# Patient Record
Sex: Male | Born: 1970 | Race: Black or African American | Hispanic: No | Marital: Single | Smoking: Current every day smoker
Health system: Southern US, Community
[De-identification: ages and names within clinical notes are randomized; demographics above are authoritative.]

## PROBLEM LIST (undated history)

## (undated) DIAGNOSIS — E119 Type 2 diabetes mellitus without complications: Secondary | ICD-10-CM

## (undated) HISTORY — PX: HERNIA REPAIR: SHX51

---

## 2016-07-19 ENCOUNTER — Emergency Department
Admission: EM | Admit: 2016-07-19 | Discharge: 2016-07-20 | Disposition: A | Discharge status: Home / Self Care | Payer: Self-pay | Attending: Student in an Organized Health Care Education/Training Program | Admitting: Student in an Organized Health Care Education/Training Program

## 2016-07-19 DIAGNOSIS — E119 Type 2 diabetes mellitus without complications: Secondary | ICD-10-CM | POA: Insufficient documentation

## 2016-07-19 DIAGNOSIS — L918 Other hypertrophic disorders of the skin: Secondary | ICD-10-CM | POA: Insufficient documentation

## 2016-07-19 DIAGNOSIS — F17200 Nicotine dependence, unspecified, uncomplicated: Secondary | ICD-10-CM | POA: Insufficient documentation

## 2016-07-19 HISTORY — DX: Type 2 diabetes mellitus without complications: E11.9

## 2016-07-19 NOTE — ED Triage Notes (Signed)
Pt presents to ED with c/o left-sided scrotal bleeding for the past three hrs. Pt reports he "scratched a mole" off his scrotum and it has been bleeding every since. Pt denies any c/o pain; reports use of daily aspirin, but denies use of other blood thinners.

## 2016-07-20 NOTE — Discharge Instructions (Signed)
You appear to have had a traumatic removal of a skin tag on your scrotum. Continue to monitor the area. Apply direct pressure to the skin if bleeding recurs. Follow-up with dermatology for definitive treatment as needed.

## 2016-07-20 NOTE — ED Provider Notes (Signed)
[facility] Emergency Department Provider Note ____________________________________________  Time seen: 2345  I have reviewed the triage vital signs and the nursing notes.  HISTORY  Chief Complaint  Groin Pain  HPI William Mcneil is a 45 y.o. male presents to the ED for evaluation of now resolved, bleeding to his scrotum on the left side. Patient describes he was at home sitting on the couch, when he noticed a wet sensation to his underwear. He thought at the time he actually may have been sweating. When he looked down he noticed that he had blood that has soaked through his underwear at his pants, onto a leather couch.He denies any pain, injury, trauma or swelling to the scrotum or testicles. He believes he may have accidentally scratched or needed a skin tag on his scrotum. He did this point reports the leading is completely resolved. He reports noting a large clot in his underwear at the outset. He describes the bleeding lasted about 3 hours from the time of onset. He reports now at the encouragement of his wife for further evaluation. He denies any dysuria, hematuria, or previous history. He denies any use of blood thinners but takes a baby aspirin, most days.  Past Medical History:  Diagnosis Date  . Diabetes mellitus without complication (HCC)     There are no active problems to display for this patient.   Past Surgical History:  Procedure Laterality Date  . HERNIA REPAIR      Prior to Admission medications   Not on File   Allergies Morphine and related  No family history on file.  Social History Social History  Substance Use Topics  . Smoking status: Current Every Day Smoker  . Smokeless tobacco: Never Used  . Alcohol use Yes    Review of Systems  Constitutional: Negative for fever. Cardiovascular: Negative for chest pain. Respiratory: Negative for shortness of breath. Gastrointestinal: Negative for abdominal pain, vomiting and  diarrhea. Genitourinary: Negative for dysuria. Bleeding from the scrotum as above. Musculoskeletal: Negative for back pain. Skin: Negative for rash. Admits to multiple skin tags to the scrotum. Neurological: Negative for headaches, focal weakness or numbness. ____________________________________________  PHYSICAL EXAM:  VITAL SIGNS: ED Triage Vitals  Enc Vitals Group     BP 07/19/16 2213 134/84     Pulse Rate 07/19/16 2213 84     Resp 07/19/16 2213 18     Temp 07/19/16 2213 98.6 F (37 C)     Temp Source 07/19/16 2213 Oral     SpO2 07/19/16 2213 98 %     Weight 07/19/16 2213 220 lb (99.8 kg)     Height 07/19/16 2213 6\' 3"  (1.905 m)     Head Circumference --      Peak Flow --      Pain Score 07/19/16 2219 0     Pain Loc --      Pain Edu? --      Excl. in GC? --    Constitutional: Alert and oriented. Well appearing and in no distress. Head: Normocephalic and atraumatic. Cardiovascular: Normal rate, regular rhythm. Normal distal pulses. Respiratory: Normal respiratory effort.  Musculoskeletal: Nontender with normal range of motion in all extremities.  Skin:  Skin is warm, dry and intact. No rash noted. Patient noted to have multiple contacts to the scrotum without any signs of infection, induration, or swelling. He is noted to have a small superficial abrasion to the scrotum in the area where he believes the bleeding occurred. It  appears that a small skin tag had been dislodged from the same area. No active bleeding is noted. ____________________________________________  INITIAL IMPRESSION / ASSESSMENT AND PLAN / ED COURSE  Patient with recent accidental/incidental trauma to a skin tag on his scrotum, resulting in bleeding. Patient now with bleeding resolved and no residual inflammation, induration, or infection noted to the scrotum. He is discharged with instructions to continue to monitor area as necessary. He will avoid any trauma to the scrotum going forward. He was further  referred to dermatology for definitive treatment of his remaining skin tags.  Clinical Course    ____________________________________________  FINAL CLINICAL IMPRESSION(S) / ED DIAGNOSES  Final diagnoses:  Skin tag      Lissa HoardJenise V Bacon Margarett Viti, PA-C 07/20/16 0037    Willy EddyPatrick Robinson, MD 07/20/16 [phone]

## 2016-11-24 ENCOUNTER — Emergency Department
Admission: EM | Admit: 2016-11-24 | Discharge: 2016-11-24 | Disposition: A | Discharge status: Home / Self Care | Payer: Self-pay | Attending: Emergency Medicine | Admitting: Emergency Medicine

## 2016-11-24 ENCOUNTER — Encounter: Payer: Self-pay | Admitting: Emergency Medicine

## 2016-11-24 DIAGNOSIS — Y999 Unspecified external cause status: Secondary | ICD-10-CM | POA: Insufficient documentation

## 2016-11-24 DIAGNOSIS — Y929 Unspecified place or not applicable: Secondary | ICD-10-CM | POA: Insufficient documentation

## 2016-11-24 DIAGNOSIS — F17200 Nicotine dependence, unspecified, uncomplicated: Secondary | ICD-10-CM | POA: Insufficient documentation

## 2016-11-24 DIAGNOSIS — E119 Type 2 diabetes mellitus without complications: Secondary | ICD-10-CM | POA: Insufficient documentation

## 2016-11-24 DIAGNOSIS — X58XXXA Exposure to other specified factors, initial encounter: Secondary | ICD-10-CM | POA: Insufficient documentation

## 2016-11-24 DIAGNOSIS — Y939 Activity, unspecified: Secondary | ICD-10-CM | POA: Insufficient documentation

## 2016-11-24 DIAGNOSIS — S39012A Strain of muscle, fascia and tendon of lower back, initial encounter: Secondary | ICD-10-CM | POA: Insufficient documentation

## 2016-11-24 MED ORDER — KETOROLAC TROMETHAMINE 60 MG/2ML IM SOLN
60.0000 mg | Freq: Once | INTRAMUSCULAR | Status: AC
  Administered 2016-11-24: 60 mg via INTRAMUSCULAR
  Filled 2016-11-24: qty 2

## 2016-11-24 MED ORDER — MELOXICAM 15 MG PO TABS: 15.0000 mg | ORAL_TABLET | Freq: Every day | ORAL | 0 refills | Status: DC

## 2016-11-24 MED ORDER — ORPHENADRINE CITRATE 30 MG/ML IJ SOLN
60.0000 mg | Freq: Once | INTRAMUSCULAR | Status: DC
  Filled 2016-11-24: qty 2

## 2016-11-24 MED ORDER — DIAZEPAM 5 MG PO TABS
5.0000 mg | ORAL_TABLET | Freq: Once | ORAL | Status: AC
  Administered 2016-11-24: 5 mg via ORAL

## 2016-11-24 MED ORDER — DIAZEPAM 5 MG PO TABS
ORAL_TABLET | ORAL | Status: AC
  Administered 2016-11-24: 5 mg via ORAL
  Filled 2016-11-24: qty 1

## 2016-11-24 MED ORDER — METHOCARBAMOL 500 MG PO TABS: 500.0000 mg | ORAL_TABLET | Freq: Four times a day (QID) | ORAL | 0 refills | Status: DC

## 2016-11-24 NOTE — ED Provider Notes (Signed)
[facility] Emergency Department Provider Note  ____________________________________________  Time seen: Approximately 10:08 PM  I have reviewed the triage vital signs and the nursing notes.   HISTORY  Chief Complaint Back Pain    HPI William Mcneil is a 46 y.o. male who presents to emergency department complaining of lower back pain. Patient denies any injury. He reports that it is bilateral lower back. He denies any repetitive motions or heavy lifting. No history of back problems in the past. He denies any radicular symptoms. He denies any bowel or bladder distention, 7 anesthesia, paresthesias. No medications prior to arrival. No other pain complaints.   Past Medical History:  Diagnosis Date  . Diabetes mellitus without complication (HCC)     There are no active problems to display for this patient.   Past Surgical History:  Procedure Laterality Date  . HERNIA REPAIR      Prior to Admission medications   Medication Sig Start Date End Date Taking? Authorizing Provider  meloxicam (MOBIC) 15 MG tablet Take 1 tablet (15 mg total) by mouth daily. 11/24/16   Delorise Royals Mahreen Schewe, PA-C  methocarbamol (ROBAXIN) 500 MG tablet Take 1 tablet (500 mg total) by mouth 4 (four) times daily. 11/24/16   Delorise Royals Nakai Pollio, PA-C    Allergies Morphine and related  No family history on file.  Social History Social History  Substance Use Topics  . Smoking status: Current Every Day Smoker  . Smokeless tobacco: Never Used  . Alcohol use Yes     Review of Systems  Constitutional: No fever/chills Cardiovascular: no chest pain. Respiratory: no cough. No SOB. Gastrointestinal: No abdominal pain.  No nausea, no vomiting.  No diarrhea.  No constipation. Genitourinary: Negative for dysuria. No hematuria Musculoskeletal: Positive for bilateral lower back pain Skin: Negative for rash, abrasions, lacerations, ecchymosis. Neurological: Negative for headaches, focal  weakness or numbness. 10-point ROS otherwise negative.  ____________________________________________   PHYSICAL EXAM:  VITAL SIGNS: ED Triage Vitals  Enc Vitals Group     BP 11/24/16 2052 121/73     Pulse Rate 11/24/16 2052 80     Resp 11/24/16 2052 18     Temp 11/24/16 2052 98 F (36.7 C)     Temp Source 11/24/16 2052 Oral     SpO2 11/24/16 2052 100 %     Weight 11/24/16 2051 230 lb (104.3 kg)     Height 11/24/16 2051  (1.905 m)     Head Circumference --      Peak Flow --      Pain Score 11/24/16 2051 8     Pain Loc --      Pain Edu? --      Excl. in GC? --      Constitutional: Alert and oriented. Well appearing and in no acute distress. Eyes: Conjunctivae are normal. PERRL. EOMI. Head: Atraumatic. Neck: No stridor.    Cardiovascular: Normal rate, regular rhythm. Normal S1 and S2.  Good peripheral circulation. Respiratory: Normal respiratory effort without tachypnea or retractions. Lungs CTAB. Good air entry to the bases with no decreased or absent breath sounds. Musculoskeletal: Full range of motion to all extremities. No gross deformities appreciated. No visible foreign spine upon inspection. No midline tenderness. Patient is very tender to palpation bilateral paraspinal muscle groups greater on right than left. Palpable spasms are appreciated right-sided paraspinal muscle group. Patient is moderately tender to palpation and right sciatic notch. No tenderness to palpation on the left sciatic notch. Negative straight  leg raise bilaterally. Dorsalis pedis pulse intact bilateral lower shin raise. Sensation intact and equal bilateral lower extremity's. Neurologic:  Normal speech and language. No gross focal neurologic deficits are appreciated.  Skin:  Skin is warm, dry and intact. No rash noted. Psychiatric: Mood and affect are normal. Speech and behavior are normal. Patient exhibits appropriate insight and judgement.   ____________________________________________    LABS (all labs ordered are listed, but only abnormal results are displayed)  Labs Reviewed - No data to display ____________________________________________  EKG   ____________________________________________  RADIOLOGY   No results found.  ____________________________________________    PROCEDURES  Procedure(s) performed:    Procedures    Medications  ketorolac (TORADOL) injection 60 mg (not administered)  orphenadrine (NORFLEX) injection 60 mg (not administered)     ____________________________________________   INITIAL IMPRESSION / ASSESSMENT AND PLAN / ED COURSE  Pertinent labs & imaging results that were available during my care of the patient were reviewed by me and considered in my medical decision making (see chart for details).  Review of the [place] CSRS was performed in accordance of the [facility] prior to dispensing any controlled drugs.     Patient's diagnosis is consistent with lumbar or spinal muscle strain. No injury. No concerning symptoms. No indication for imaging at this time. Patient is given Toradol muscle relaxer injection in the emergency department.. Patient will be discharged home with prescriptions for anti-inflammatory and muscle relaxer. Patient is to follow up with orthopedics as needed or otherwise directed. Patient is given ED precautions to return to the ED for any worsening or new symptoms.     ____________________________________________  FINAL CLINICAL IMPRESSION(S) / ED DIAGNOSES  Final diagnoses:  Strain of lumbar region, initial encounter      NEW MEDICATIONS STARTED DURING THIS VISIT:  New Prescriptions   MELOXICAM (MOBIC) 15 MG TABLET    Take 1 tablet (15 mg total) by mouth daily.   METHOCARBAMOL (ROBAXIN) 500 MG TABLET    Take 1 tablet (500 mg total) by mouth 4 (four) times daily.        This chart was dictated using voice recognition software/Dragon. Despite best efforts to proofread, errors can occur which  can change the meaning. Any change was purely unintentional.    Racheal Patches, PA-C 11/24/16 2227    Minna Antis, MD 11/24/16 2250

## 2016-11-24 NOTE — ED Triage Notes (Signed)
Patient ambulatory to triage with steady gait, without difficulty or distress noted; pt reports lower back pain x 2 days; denies any known injury, denies hx of same, denies any accomp symptoms or radiating pain

## 2016-11-24 NOTE — ED Notes (Signed)
Pharmacy called for medication.

## 2016-11-29 ENCOUNTER — Emergency Department (HOSPITAL_COMMUNITY)
Admission: EM | Admit: 2016-11-29 | Discharge: 2016-11-29 | Disposition: A | Discharge status: Home / Self Care | Payer: Self-pay | Attending: Emergency Medicine | Admitting: Emergency Medicine

## 2016-11-29 ENCOUNTER — Encounter (HOSPITAL_COMMUNITY): Payer: Self-pay

## 2016-11-29 DIAGNOSIS — M62830 Muscle spasm of back: Secondary | ICD-10-CM | POA: Insufficient documentation

## 2016-11-29 DIAGNOSIS — E119 Type 2 diabetes mellitus without complications: Secondary | ICD-10-CM | POA: Insufficient documentation

## 2016-11-29 DIAGNOSIS — F17200 Nicotine dependence, unspecified, uncomplicated: Secondary | ICD-10-CM | POA: Insufficient documentation

## 2016-11-29 MED ORDER — DIAZEPAM 2 MG PO TABS
2.0000 mg | ORAL_TABLET | Freq: Once | ORAL | Status: AC
  Administered 2016-11-29: 2 mg via ORAL
  Filled 2016-11-29: qty 1

## 2016-11-29 MED ORDER — DIAZEPAM 2 MG PO TABS: 2.0000 mg | ORAL_TABLET | Freq: Three times a day (TID) | ORAL | 0 refills | Status: DC | PRN
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
## 2016-11-29 NOTE — ED Notes (Signed)
See provider assessment 

## 2016-11-29 NOTE — ED Triage Notes (Addendum)
Pt reports lower back spasms and pain, onset 3-4 days ago. Denies trouble urinating or abnormal urinary symptoms. Denies recent injury except for bending over on Wednesday and then he noticed the pain and is progressively getting worse.. Ambulatory, NAD.

## 2016-11-29 NOTE — Discharge Instructions (Signed)
Use the valium as directed for muscle spasms. You may break the tablet in half if needed.  If continued symptoms despite treatment, please follow-up with the referral provider listed.

## 2017-03-22 ENCOUNTER — Encounter: Payer: Self-pay | Admitting: Emergency Medicine

## 2017-03-22 ENCOUNTER — Emergency Department
Admission: EM | Admit: 2017-03-22 | Discharge: 2017-03-22 | Disposition: A | Discharge status: Home / Self Care | Payer: Self-pay | Attending: Emergency Medicine | Admitting: Emergency Medicine

## 2017-03-22 DIAGNOSIS — E119 Type 2 diabetes mellitus without complications: Secondary | ICD-10-CM | POA: Insufficient documentation

## 2017-03-22 DIAGNOSIS — H9202 Otalgia, left ear: Secondary | ICD-10-CM

## 2017-03-22 DIAGNOSIS — Z79899 Other long term (current) drug therapy: Secondary | ICD-10-CM | POA: Insufficient documentation

## 2017-03-22 DIAGNOSIS — H60502 Unspecified acute noninfective otitis externa, left ear: Secondary | ICD-10-CM | POA: Insufficient documentation

## 2017-03-22 DIAGNOSIS — F17200 Nicotine dependence, unspecified, uncomplicated: Secondary | ICD-10-CM | POA: Insufficient documentation

## 2017-03-22 MED ORDER — LIDOCAINE HCL (PF) 1 % IJ SOLN
2.0000 mL | Freq: Once | INTRAMUSCULAR | Status: AC
  Administered 2017-03-22: 2 mL
  Filled 2017-03-22: qty 5

## 2017-03-22 MED ORDER — CIPROFLOXACIN-DEXAMETHASONE 0.3-0.1 % OT SUSP
4.0000 [drp] | Freq: Once | OTIC | Status: AC
  Administered 2017-03-22: 4 [drp] via OTIC
  Filled 2017-03-22: qty 7.5

## 2017-03-22 NOTE — ED Triage Notes (Signed)
Patient ambulatory to triage with steady gait, without difficulty or distress noted; pt reports pain to left ear last few days with some drainage; denies any recent illness

## 2017-03-22 NOTE — Discharge Instructions (Signed)
1. Apply antibiotic drops 4 drops to left ear twice daily 7 days. 2. You may take Tylenol and/or ibuprofen as needed for pain. 3. Return to the ER for worsening symptoms, persistent vomiting, difficulty breathing or other concerns.

## 2017-03-22 NOTE — ED Notes (Signed)
Patient verbalizes understanding of d/c instructions and follow-up. VS stable and pain controlled per patient.  Patient in NAD at time of d/c and denies further concerns regarding this visit. Patient stable at the time of departure from the unit, departing unit by the safest and most appropriate manner per that patients condition and limitations. Patient advised to return to the ED at any time for emergent concerns, or for new/worsening symptoms.   Pt refused wheel chair out  

## 2017-03-22 NOTE — ED Provider Notes (Signed)
[facility] Emergency Department Provider Note   ____________________________________________   First MD Initiated Contact with Patient 03/22/17 0533     (approximate)  I have reviewed the triage vital signs and the nursing notes.   HISTORY  Chief Complaint Otalgia    HPI William Mcneil is a 46 y.o. male who presents to the ED from home with a chief complaint of left ear pain. Patient reports a 2 to three-day historyof non-traumatic left ear pain with discharge. Denies barotrauma. Denies associated fever, chills, chest pain, shortness of breath, abdominal pain, nausea, vomiting. Denies recent travel or trauma. Has not tried anything for his pain.   Past Medical History:  Diagnosis Date  . Diabetes mellitus without complication (HCC)     There are no active problems to display for this patient.   Past Surgical History:  Procedure Laterality Date  . HERNIA REPAIR      Prior to Admission medications   Medication Sig Start Date End Date Taking? Authorizing Provider  diazepam (VALIUM) 2 MG tablet Take 1 tablet (2 mg total) by mouth every 8 (eight) hours as needed for muscle spasms. 11/29/16   [name], NP  meloxicam (MOBIC) 15 MG tablet Take 1 tablet (15 mg total) by mouth daily. 11/24/16   Cuthriell, Delorise Royals, PA-C  methocarbamol (ROBAXIN) 500 MG tablet Take 1 tablet (500 mg total) by mouth 4 (four) times daily. 11/24/16   Cuthriell, Delorise Royals, PA-C    Allergies Morphine and related  No family history on file.  Social History Social History  Substance Use Topics  . Smoking status: Current Every Day Smoker  . Smokeless tobacco: Never Used  . Alcohol use Yes    Review of Systems  Constitutional: No fever/chills. Eyes: No visual changes. ENT: positive for left ear pain. No sore throat. Cardiovascular: Denies chest pain. Respiratory: Denies shortness of breath. Gastrointestinal: No abdominal pain.  No nausea, no vomiting.  No diarrhea.   No constipation. Genitourinary: Negative for dysuria. Musculoskeletal: Negative for back pain. Skin: Negative for rash. Neurological: Negative for headaches, focal weakness or numbness.   ____________________________________________   PHYSICAL EXAM:  VITAL SIGNS: ED Triage Vitals  Enc Vitals Group     BP 03/22/17 0413 138/76     Pulse Rate 03/22/17 0413 86     Resp 03/22/17 0413 20     Temp 03/22/17 0413 98 F (36.7 C)     Temp Source 03/22/17 0413 Oral     SpO2 03/22/17 0413 98 %     Weight 03/22/17 0412 235 lb (106.6 kg)     Height 03/22/17 0412 6\' 3"  (1.905 m)     Head Circumference --      Peak Flow --      Pain Score 03/22/17 0411 8     Pain Loc --      Pain Edu? --      Excl. in GC? --     Constitutional: Alert and oriented. Well appearing and in no acute distress. Eyes: Conjunctivae are normal. PERRL. EOMI. Head: Atraumatic. Ears: Right TM within normal limits. Left ear canal mildly swollen with purulent material. TM within normal limits. Nose: No congestion/rhinnorhea. Mouth/Throat: Mucous membranes are moist.  Oropharynx non-erythematous. Neck: No stridor.   Cardiovascular: Normal rate, regular rhythm. Grossly normal heart sounds.  Good peripheral circulation. Respiratory: Normal respiratory effort.  No retractions. Lungs CTAB. Gastrointestinal: Soft and nontender. No distention. No abdominal bruits. No CVA tenderness. Musculoskeletal: No lower extremity tenderness nor  edema.  No joint effusions. Neurologic:  Normal speech and language. No gross focal neurologic deficits are appreciated. No gait instability. Skin:  Skin is warm, dry and intact. No rash noted. Psychiatric: Mood and affect are normal. Speech and behavior are normal.  ____________________________________________   LABS (all labs ordered are listed, but only abnormal results are displayed)  Labs Reviewed - No data to  display ____________________________________________  EKG  none ____________________________________________  RADIOLOGY  No results found.  ____________________________________________   PROCEDURES  Procedure(s) performed: None  Procedures  Critical Care performed: No  ____________________________________________   INITIAL IMPRESSION / ASSESSMENT AND PLAN / ED COURSE  Pertinent labs & imaging results that were available during my care of the patient were reviewed by me and considered in my medical decision making (see chart for details).  46 year old male who presents with left otalgia secondary to otitis externa. Lidocaine drops applied for pain. Will place Ciprodex drops to left ear. Patient instructed to place 4 drops to the left ear twice daily for 7 days. Strict return precautions given. Patient verbalizes understanding and agrees with plan of care.      ____________________________________________   FINAL CLINICAL IMPRESSION(S) / ED DIAGNOSES  Final diagnoses:  Otalgia of left ear  Acute otitis externa of left ear, unspecified type      NEW MEDICATIONS STARTED DURING THIS VISIT:  New Prescriptions   No medications on file     Note:  This document was prepared using Dragon voice recognition software and may include unintentional dictation errors.    [name], MD 03/22/17 [phone]

## 2017-09-22 ENCOUNTER — Emergency Department: Payer: 59

## 2017-09-22 ENCOUNTER — Emergency Department
Admission: EM | Admit: 2017-09-22 | Discharge: 2017-09-22 | Disposition: A | Discharge status: Home / Self Care | Payer: 59 | Attending: Emergency Medicine | Admitting: Emergency Medicine

## 2017-09-22 ENCOUNTER — Encounter: Payer: Self-pay | Admitting: Emergency Medicine

## 2017-09-22 ENCOUNTER — Other Ambulatory Visit: Payer: Self-pay

## 2017-09-22 DIAGNOSIS — R1030 Lower abdominal pain, unspecified: Secondary | ICD-10-CM | POA: Insufficient documentation

## 2017-09-22 DIAGNOSIS — R109 Unspecified abdominal pain: Secondary | ICD-10-CM

## 2017-09-22 DIAGNOSIS — Z79899 Other long term (current) drug therapy: Secondary | ICD-10-CM | POA: Diagnosis not present

## 2017-09-22 DIAGNOSIS — F17200 Nicotine dependence, unspecified, uncomplicated: Secondary | ICD-10-CM | POA: Insufficient documentation

## 2017-09-22 DIAGNOSIS — R197 Diarrhea, unspecified: Secondary | ICD-10-CM | POA: Diagnosis not present

## 2017-09-22 DIAGNOSIS — E119 Type 2 diabetes mellitus without complications: Secondary | ICD-10-CM | POA: Diagnosis not present

## 2017-09-22 LAB — URINALYSIS, COMPLETE (UACMP) WITH MICROSCOPIC
BILIRUBIN URINE: NEGATIVE
GLUCOSE, UA: NEGATIVE mg/dL
HGB URINE DIPSTICK: NEGATIVE
Ketones, ur: NEGATIVE mg/dL
Leukocytes, UA: NEGATIVE
NITRITE: NEGATIVE
PROTEIN: NEGATIVE mg/dL
SPECIFIC GRAVITY, URINE: 1.041 — AB (ref 1.005–1.030)
Squamous Epithelial / LPF: NONE SEEN
pH: 7 (ref 5.0–8.0)

## 2017-09-22 LAB — CBC WITH DIFFERENTIAL/PLATELET
Basophils Absolute: 0 10*3/uL (ref 0–0.1)
Basophils Relative: 1 %
EOS ABS: 0.2 10*3/uL (ref 0–0.7)
Eosinophils Relative: 3 %
HCT: 40.9 % (ref 40.0–52.0)
Hemoglobin: 13.9 g/dL (ref 13.0–18.0)
Lymphocytes Relative: 43 %
Lymphs Abs: 2.3 10*3/uL (ref 1.0–3.6)
MCH: 30.7 pg (ref 26.0–34.0)
MCHC: 33.9 g/dL (ref 32.0–36.0)
MCV: 90.6 fL (ref 80.0–100.0)
MONO ABS: 0.2 10*3/uL (ref 0.2–1.0)
MONOS PCT: 3 %
Neutro Abs: 2.7 10*3/uL (ref 1.4–6.5)
Neutrophils Relative %: 50 %
PLATELETS: 206 10*3/uL (ref 150–440)
RBC: 4.52 MIL/uL (ref 4.40–5.90)
RDW: 14.2 % (ref 11.5–14.5)
WBC: 5.4 10*3/uL (ref 3.8–10.6)

## 2017-09-22 LAB — COMPREHENSIVE METABOLIC PANEL
ALBUMIN: 4 g/dL (ref 3.5–5.0)
ALT: 16 U/L — ABNORMAL LOW (ref 17–63)
ANION GAP: 7 (ref 5–15)
AST: 23 U/L (ref 15–41)
Alkaline Phosphatase: 78 U/L (ref 38–126)
BUN: 12 mg/dL (ref 6–20)
CHLORIDE: 108 mmol/L (ref 101–111)
CO2: 27 mmol/L (ref 22–32)
Calcium: 8.8 mg/dL — ABNORMAL LOW (ref 8.9–10.3)
Creatinine, Ser: 0.95 mg/dL (ref 0.61–1.24)
GFR calc Af Amer: >60 mL/min (ref >60)
GFR calc non Af Amer: >60 mL/min (ref >60)
GLUCOSE: 124 mg/dL — AB (ref 65–99)
POTASSIUM: 4 mmol/L (ref 3.5–5.1)
SODIUM: 142 mmol/L (ref 135–145)
Total Bilirubin: 0.5 mg/dL (ref 0.3–1.2)
Total Protein: 7 g/dL (ref 6.5–8.1)

## 2017-09-22 LAB — LIPASE, BLOOD: Lipase: 20 U/L (ref 11–51)

## 2017-09-22 LAB — TROPONIN I: Troponin I: <0.03 ng/mL (ref <0.03)

## 2017-09-22 MED ORDER — FAMOTIDINE IN NACL 20-0.9 MG/50ML-% IV SOLN
20.0000 mg | Freq: Once | INTRAVENOUS | Status: AC
  Administered 2017-09-22: 20 mg via INTRAVENOUS
  Filled 2017-09-22: qty 50

## 2017-09-22 MED ORDER — SODIUM CHLORIDE 0.9 % IV BOLUS (SEPSIS)
1000.0000 mL | Freq: Once | INTRAVENOUS | Status: AC
  Administered 2017-09-22: 1000 mL via INTRAVENOUS

## 2017-09-22 MED ORDER — IOPAMIDOL (ISOVUE-300) INJECTION 61%
15.0000 mL | INTRAVENOUS | Status: AC
  Administered 2017-09-22 (×2): 15 mL via ORAL

## 2017-09-22 MED ORDER — IOPAMIDOL (ISOVUE-300) INJECTION 61%
100.0000 mL | Freq: Once | INTRAVENOUS | Status: AC | PRN
  Administered 2017-09-22: 100 mL via INTRAVENOUS

## 2017-09-22 MED ORDER — DICYCLOMINE HCL 10 MG/ML IM SOLN
20.0000 mg | Freq: Once | INTRAMUSCULAR | Status: AC
  Administered 2017-09-22: 20 mg via INTRAMUSCULAR
  Filled 2017-09-22: qty 2

## 2017-09-22 NOTE — ED Notes (Signed)
Lab called about troponin, states it is being run at this time.

## 2017-09-22 NOTE — ED Provider Notes (Signed)
[facility]  [facility] care from Dr. Dolores FrameSung ____________________________________________    LABS (pertinent positives/negatives)  I, Governor Rooksebecca Shawny Borkowski, MD have personally reviewed the lab reports noted below.  Labs Reviewed  COMPREHENSIVE METABOLIC PANEL - Abnormal; Notable for the following components:      Result Value   Glucose, Bld 124 (*)    Calcium 8.8 (*)    ALT 16 (*)    All other components within normal limits  URINALYSIS, COMPLETE (UACMP) WITH MICROSCOPIC - Abnormal; Notable for the following components:   Color, Urine STRAW (*)    APPearance CLEAR (*)    Specific Gravity, Urine 1.041 (*)    Bacteria, UA RARE (*)    All other components within normal limits  C DIFFICILE QUICK SCREEN W PCR REFLEX  GASTROINTESTINAL PANEL BY PCR, STOOL (REPLACES STOOL CULTURE)  URINE CULTURE  CBC WITH DIFFERENTIAL/PLATELET  LIPASE, BLOOD  TROPONIN I     ____________________________________________    RADIOLOGY All xrays were viewed by me. Imaging interpreted by radiologist.  I, Governor Rooksebecca Daira Hine MD have personally reviewed the imaging report noted below.  CT abdomen pelvis with contrast radiologist report:  IMPRESSION: 1. Circumferential bladder wall thickening suggesting Acute Cystitis or UTI. There is questionable urothelial thickening in both distal ureters, but the upper ureters and both kidneys appear normal. No urologic calculus identified. 2. No large or small bowel inflammation identified. Normal appendix. Retained stool throughout the colon. 3. Calcified aortic atherosclerosis appears advanced for age. Borderline to mild cardiomegaly.  ____________________________________________   PROCEDURES  Procedure(s) performed: None  Critical Care performed: None  ____________________________________________   INITIAL IMPRESSION / ASSESSMENT AND PLAN / ED COURSE   Pertinent labs & imaging results that were available during my care of the patient were  reviewed by me and considered in my medical decision making (see chart for details).  Patient laboratory studies are reassuring.  Urinalysis resulted rare bacteria with 0-5 white blood cells, no leukocytes and no nitrites.  Patient is denying any urinary symptoms at all.  His complaint is upper abdominal cramping and loose stools for several weeks.  We discussed that sending a urine culture seems like the best option, because the risk of treating with an antibiotic for urinary tract infection if he does not have one, could definitely worsen the symptoms for which he came in, the GI symptoms.  He was given the ER number in order to call back over the next several days to ensure he gets a result from the urine culture.  He did not have any loose stools here in the emergency department.  I am to send him home with a collection cup which he could follow-up with his primary doctor.  I am going to recommend they follow-up with a GI doctor.  He declined with rectal exam/Hemoccult testing.  We did discuss probiotic and prebiotic supplementation and return precautions.     Patient / Family / Caregiver informed of clinical course, medical decision-making process, and agree with plan.   I discussed return precautions, follow-up instructions, and discharged instructions with patient and/or family.   Discharge instructions:  You are evaluated for abdominal cramping and diarrhea, and as we discussed, no certain cause was found, but your exam and evaluation are overall reassuring in the emergency department today.  Return to the emergency department immediately for any black or bloody stool, fever, concern for dehydration such as dry mouth or not making urine, urinary symptoms such as burning with urination, hesitancy or frequency, blood  with urination, vomiting blood, or any other symptoms concerning to you.  We did discuss increase probiotics including probiotic pill, yogurt, sauerkraut, or other  "fermented foods.  "As well as feeding your "good bacteria "with fiber and avoiding sugar.  I am recommending that you follow-up with primary care doctor, if you have a loose stool you may take your stool sample and then they may turn into the laboratory to test for some causes of chronic ongoing diarrhea.  I am also recommending that you follow with a GI doctor.   ____________________________________________   FINAL CLINICAL IMPRESSION(S) / ED DIAGNOSES  Final diagnoses:  Abdominal pain, unspecified abdominal location  Diarrhea, unspecified type        Governor Rooks, MD 09/22/17 1010

## 2017-09-22 NOTE — ED Notes (Signed)
Pt given sample cup for home collection of stool sample.

## 2017-09-22 NOTE — Discharge Instructions (Signed)
You are evaluated for abdominal cramping and diarrhea, and as we discussed, no certain cause was found, but your exam and evaluation are overall reassuring in the emergency department today.  Return to the emergency department immediately for any black or bloody stool, fever, concern for dehydration such as dry mouth or not making urine, urinary symptoms such as burning with urination, hesitancy or frequency, blood with urination, vomiting blood, or any other symptoms concerning to you.  We did discuss increase probiotics including probiotic pill, yogurt, sauerkraut, or other "fermented foods.  "As well as feeding your "good bacteria "with fiber and avoiding sugar.  I am recommending that you follow-up with primary care doctor, if you have a loose stool you may take your stool sample and then they may turn into the laboratory to test for some causes of chronic ongoing diarrhea.  I am also recommending that you follow with a GI doctor.

## 2017-09-22 NOTE — ED Notes (Signed)
Patient transported to CT 

## 2017-09-22 NOTE — ED Provider Notes (Signed)
[facility] Emergency Department Provider Note   ____________________________________________   First MD Initiated Contact with Patient 09/22/17 [phone]     (approximate)  I have reviewed the triage vital signs and the nursing notes.   HISTORY  Chief Complaint Abdominal Pain    HPI William Mcneil is a 47 y.o. male who presents to the ED from home with a chief complaint of abdominal pain and diarrhea.  Patient reports finishing antibiotic for dental work 2 weeks ago.  States he had diarrhea the entire time he was on the antibiotic and diarrhea has persisted for 2 weeks after completion of antibiotics.  Symptoms associated with lower abdominal cramping.  Denies associated fever, chills, chest pain, shortness of breath, nausea, vomiting, diarrhea.  Feels like his symptoms have exacerbated his ulcer pain and complains of indigestion.   Past Medical History:  Diagnosis Date  . Diabetes mellitus without complication (HCC)   PUD  There are no active problems to display for this patient.   Past Surgical History:  Procedure Laterality Date  . HERNIA REPAIR      Prior to Admission medications   Medication Sig Start Date End Date Taking? Authorizing Provider  diazepam (VALIUM) 2 MG tablet Take 1 tablet (2 mg total) by mouth every 8 (eight) hours as needed for muscle spasms. 11/29/16   Felicie MornSmith, David, NP  meloxicam (MOBIC) 15 MG tablet Take 1 tablet (15 mg total) by mouth daily. 11/24/16   Cuthriell, Delorise RoyalsJonathan D, PA-C  methocarbamol (ROBAXIN) 500 MG tablet Take 1 tablet (500 mg total) by mouth 4 (four) times daily. 11/24/16   Cuthriell, Delorise RoyalsJonathan D, PA-C    Allergies Morphine and related  No family history on file.  Social History Social History   Tobacco Use  . Smoking status: Current Every Day Smoker  . Smokeless tobacco: Never Used  Substance Use Topics  . Alcohol use: Yes  . Drug use: No    Review of Systems  Constitutional: No fever/chills. Eyes: No  visual changes. ENT: No sore throat. Cardiovascular: Denies chest pain. Respiratory: Denies shortness of breath. Gastrointestinal: Positive for abdominal pain.  No nausea, no vomiting.  Positive for diarrhea.  No constipation. Genitourinary: Negative for dysuria. Musculoskeletal: Negative for back pain. Skin: Negative for rash. Neurological: Negative for headaches, focal weakness or numbness.   ____________________________________________   PHYSICAL EXAM:  VITAL SIGNS: ED Triage Vitals  Enc Vitals Group     BP 09/22/17 0412 (!) 145/78     Pulse Rate 09/22/17 0412 91     Resp 09/22/17 0412 18     Temp 09/22/17 0412 98.2 F (36.8 C)     Temp Source 09/22/17 0412 Oral     SpO2 09/22/17 0412 96 %     Weight 09/22/17 0410 220 lb (99.8 kg)     Height 09/22/17 0410 6\' 3"  (1.905 m)     Head Circumference --      Peak Flow --      Pain Score 09/22/17 0410 8     Pain Loc --      Pain Edu? --      Excl. in GC? --     Constitutional: Alert and oriented. Well appearing and in no acute distress. Eyes: Conjunctivae are normal. PERRL. EOMI. Head: Atraumatic. Nose: No congestion/rhinnorhea. Mouth/Throat: Mucous membranes are moist.  Oropharynx non-erythematous. Neck: No stridor.   Cardiovascular: Normal rate, regular rhythm. Grossly normal heart sounds.  Good peripheral circulation. Respiratory: Normal respiratory effort.  No retractions.  Lungs CTAB. Gastrointestinal: Soft and minimal tenderness to palpation lower abdomen without rebound or guarding. No distention. No abdominal bruits. No CVA tenderness. Musculoskeletal: No lower extremity tenderness nor edema.  No joint effusions. Neurologic:  Normal speech and language. No gross focal neurologic deficits are appreciated. No gait instability. Skin:  Skin is warm, dry and intact. No rash noted. Psychiatric: Mood and affect are normal. Speech and behavior are normal.  ____________________________________________   LABS (all labs  ordered are listed, but only abnormal results are displayed)  Labs Reviewed  C DIFFICILE QUICK SCREEN W PCR REFLEX  GASTROINTESTINAL PANEL BY PCR, STOOL (REPLACES STOOL CULTURE)  CBC WITH DIFFERENTIAL/PLATELET  COMPREHENSIVE METABOLIC PANEL  LIPASE, BLOOD   ____________________________________________  EKG  ED ECG REPORT I, SUNG,JADE J, the attending physician, personally viewed and interpreted this ECG.   Date: 09/22/2017  EKG Time: 0447  Rate: 78  Rhythm: normal EKG, normal sinus rhythm  Axis: LAD  Intervals:none  ST&T Change: Nonspecific  ____________________________________________  RADIOLOGY  ED MD interpretation:  Pending  Official radiology report(s): No results found.  ____________________________________________   PROCEDURES  Procedure(s) performed: None  Procedures  Critical Care performed: No  ____________________________________________   INITIAL IMPRESSION / ASSESSMENT AND PLAN / ED COURSE  As part of my medical decision making, I reviewed the following data within the electronic MEDICAL RECORD NUMBER Nursing notes reviewed and incorporated, Labs reviewed, EKG interpreted, Old chart reviewed, Radiograph reviewed and Notes from prior ED visits.   47 year old male who presents with abdominal cramping and diarrhea status post course of antibiotics. Differential diagnosis includes, but is not limited to, acute appendicitis, renal colic, testicular torsion, urinary tract infection/pyelonephritis, prostatitis,  epididymitis, diverticulitis, small bowel obstruction or ileus, colitis, abdominal aortic aneurysm, gastroenteritis, hernia, etc.  Will obtain screening lab work, stool specimens and obtain CT abdomen/pelvis to evaluate for colitis.  Clinical Course as of Sep 22 720  Thu Sep 22, 2017  0706 Patient in CT scan.  Awaiting results of CT as well and a stool specimen for analysis.  Lab work unremarkable.  Care transferred to Dr. Shaune Pollack. Disposition per  CT results.  [JS]    Clinical Course User Index [JS] William Hong, MD     ____________________________________________   FINAL CLINICAL IMPRESSION(S) / ED DIAGNOSES  Final diagnoses:  Abdominal pain, unspecified abdominal location  Diarrhea, unspecified type     ED Discharge Orders    None       Note:  This document was prepared using Dragon voice recognition software and may include unintentional dictation errors.    William Hong, MD 09/22/17 [phone]

## 2017-09-22 NOTE — ED Notes (Signed)

## 2017-09-22 NOTE — ED Triage Notes (Signed)
Patient ambulatory to triage with steady gait, without difficulty or distress noted; pt c/o abd cramping and diarrhea since completing antibiotics couple wks ago

## 2017-09-22 NOTE — ED Notes (Signed)
Pt notified stool sample is needed, hat placed in toilet for collection, pt verbalizes understanding of this.

## 2017-09-23 LAB — URINE CULTURE: Culture: NO GROWTH

## 2019-02-05 IMAGING — CT CT ABD-PELV W/ CM
2 of 5 series · 15 of 46 positions shown, 17 images · IV contrast (APPLIED)
Comparison: None.

CLINICAL DATA: 46-year-old male with abdominal pain, cramping and
diarrhea. Recent antibiotic use. Query colitis.

EXAM:
CT ABDOMEN AND PELVIS WITH CONTRAST
TECHNIQUE: Multidetector CT imaging of the abdomen and pelvis was performed
using the standard protocol following bolus administration of
intravenous contrast.
CONTRAST:  100mL D7YDWV-TWW IOPAMIDOL (D7YDWV-TWW) INJECTION 61%

[Series 2: routine abd/pel with · axial · 0.79mm/px · z∈[-943,-538]mm · 12 of 91 slices shown, 14 images]
[im 5/91  soft-tissue]
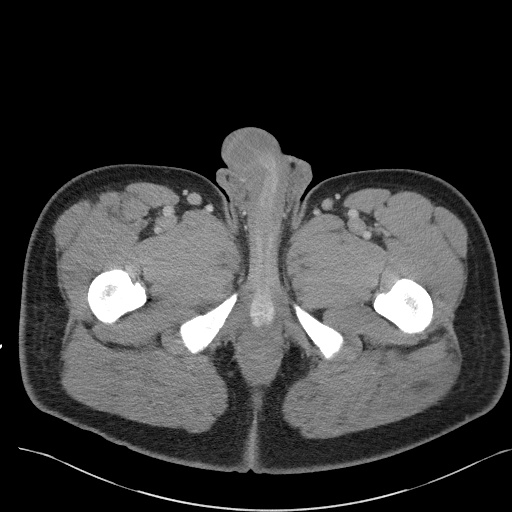
[im 5/91  bone]
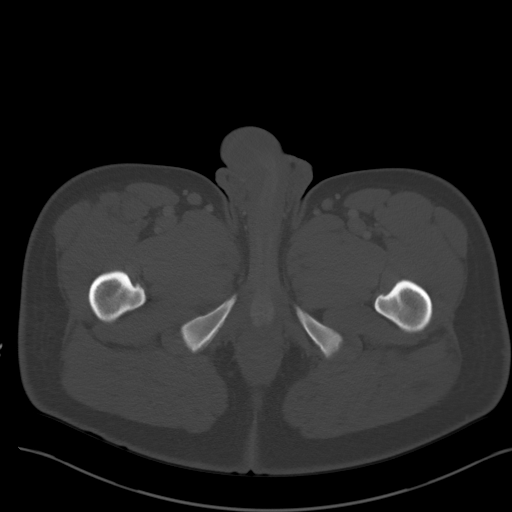
[im 14/91  soft-tissue]
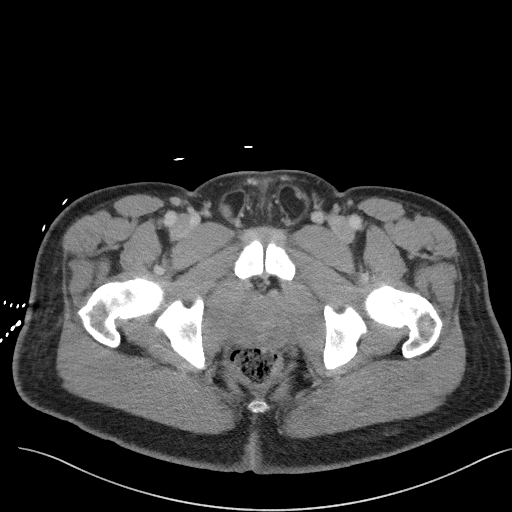
[im 19/91  soft-tissue]
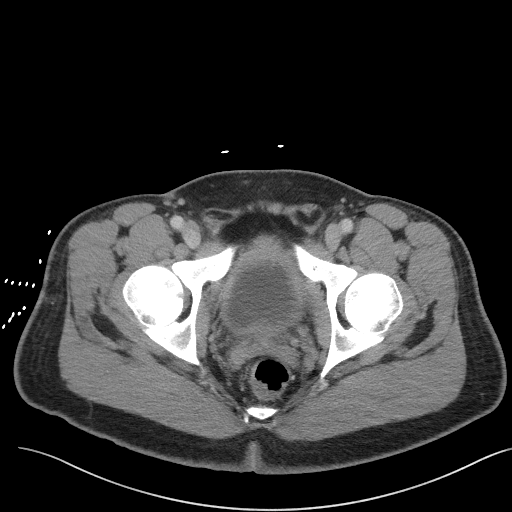
[im 28/91  soft-tissue]
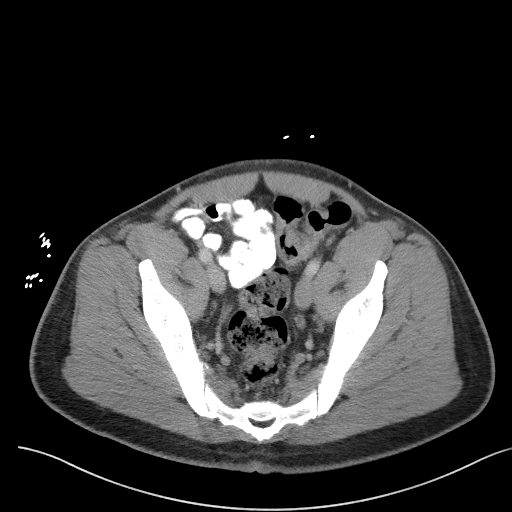
[im 37/91  soft-tissue]
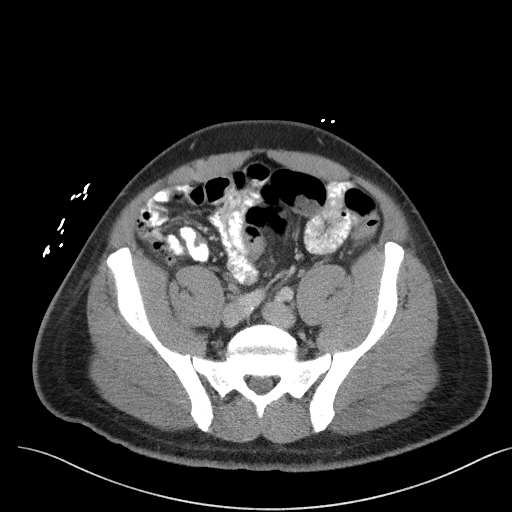
[im 41/91  soft-tissue]
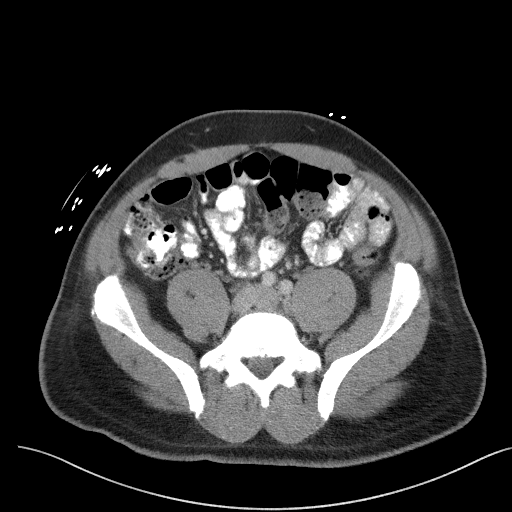
[im 50/91  soft-tissue]
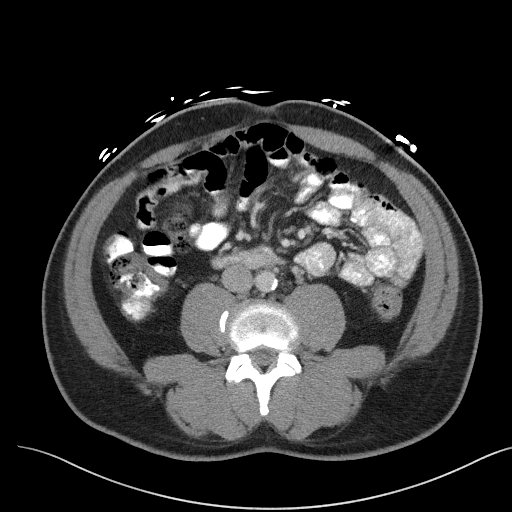
[im 55/91  soft-tissue]
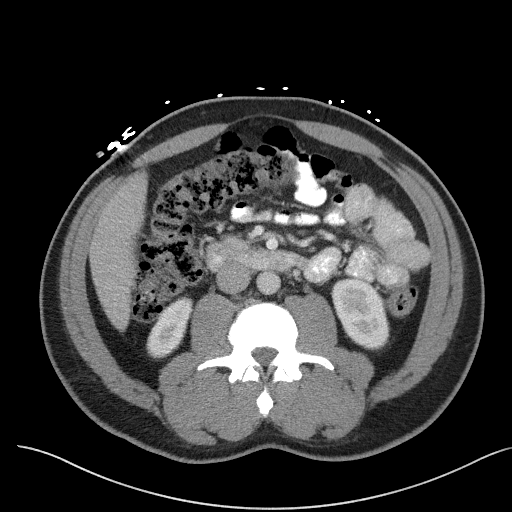
[im 64/91  soft-tissue]
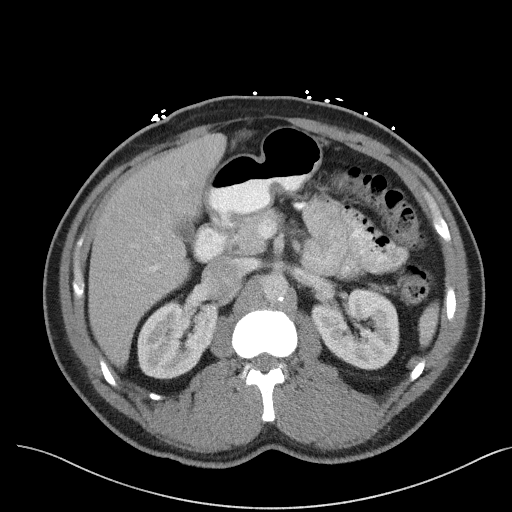
[im 64/91  bone]
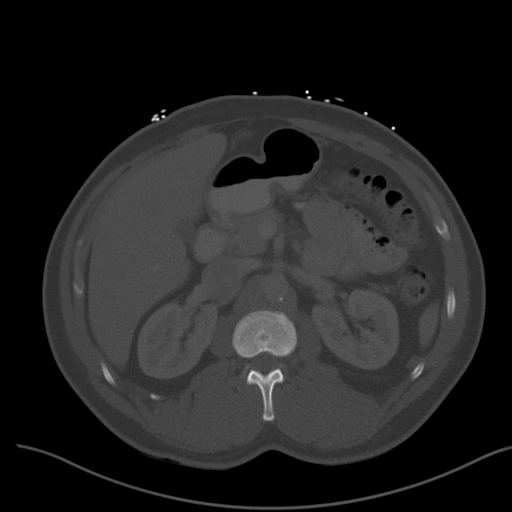
[im 73/91  soft-tissue]
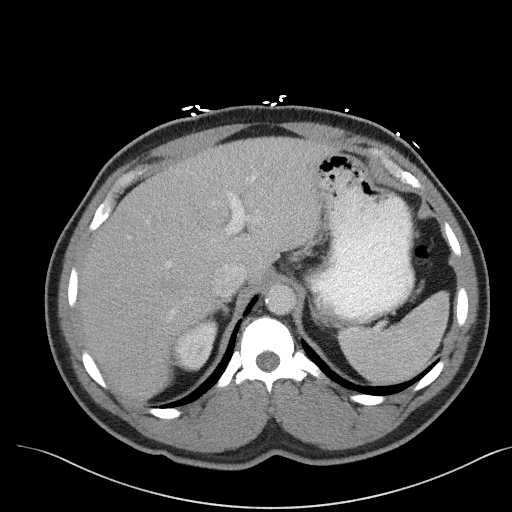
[im 77/91  soft-tissue]
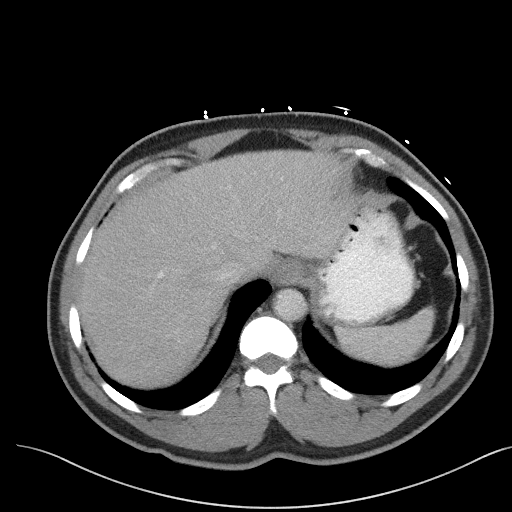
[im 86/91  soft-tissue]
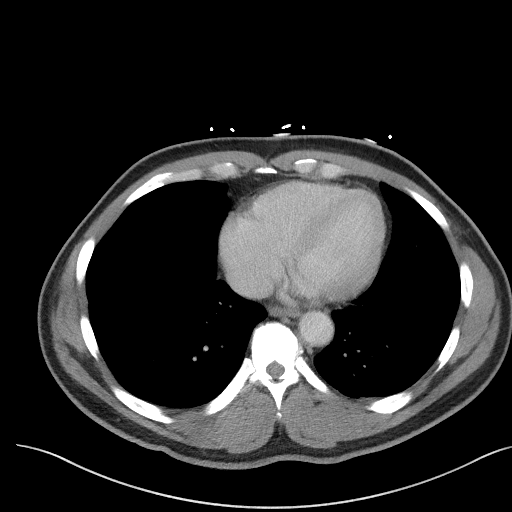

[Series 5: coronal st · coronal · 0.77mm/px · 3 of 101 slices shown]
[im 34/101  soft-tissue]
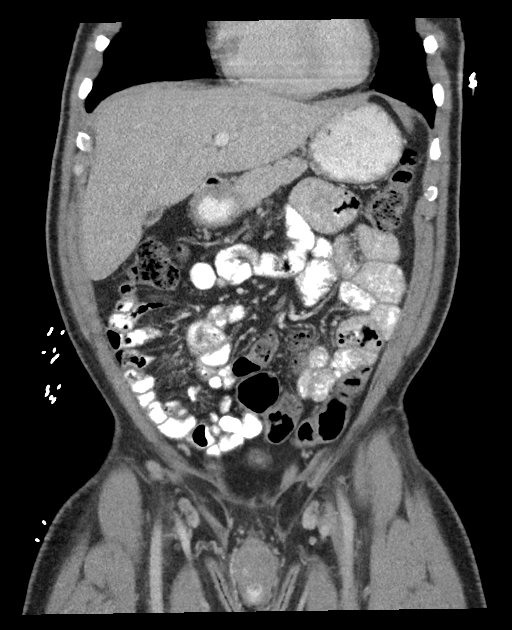
[im 45/101  soft-tissue]
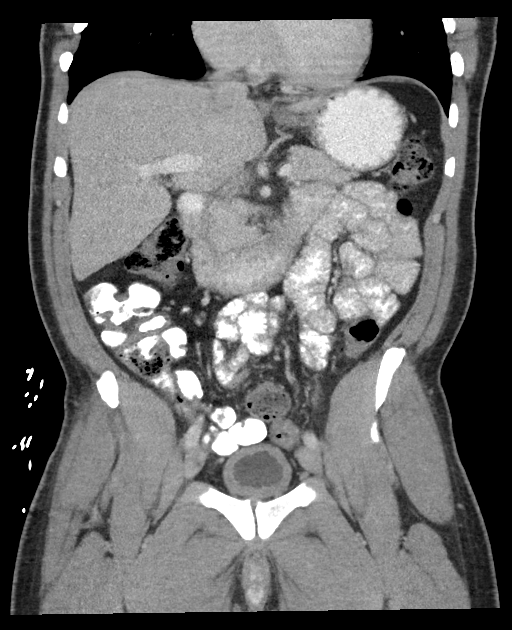
[im 56/101  soft-tissue]
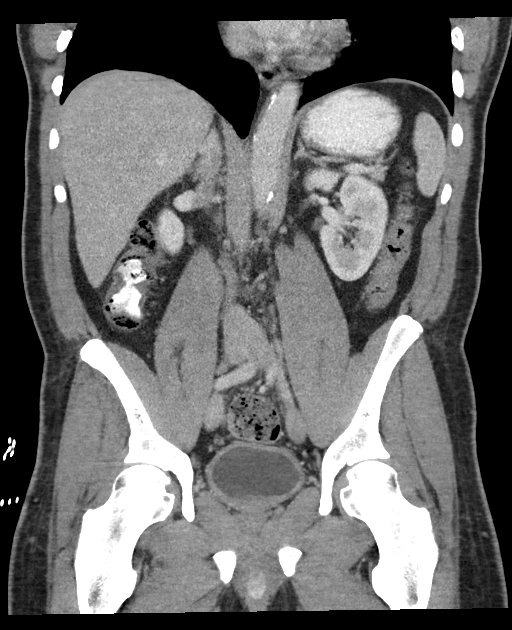

[15 of 46 positions shown; findings below may reference images not displayed]

FINDINGS: Lower chest: Cardiac size is at the upper limits of normal to mildly
enlarged. No pericardial effusion. No pleural effusion. Minor
dependent opacity in the left lower lobe most resembles atelectasis.
Minimal right lower lobe atelectasis.

Hepatobiliary: Liver and gallbladder appear normal.

Pancreas: Negative.

Spleen: Negative.

Adrenals/Urinary Tract: Normal adrenal glands.

Bilateral renal enhancement and contrast excretion is symmetric and
normal. No perinephric stranding. No periureteral stranding.

However, there is questionable urothelial thickening and enhancement
in the distal ureters (series 2, image 69). There is diffuse urinary
bladder wall thickening up to 10 millimeters (sagittal image 61). No
gas within the bladder or bladder wall. No perivesical stranding.
Numerous pelvic phleboliths, but no urologic calculus identified.

Stomach/Bowel: Negative rectum. Negative sigmoid colon aside from
retained stool and mild redundancy. Negative left colon and splenic
flexure aside from retained stool. Similar retained stool in the
transverse colon. Oral contrast has reached the ascending colon. No
right colon wall thickening is identified. No large bowel wall
thickening is identified. Normal appendix.

The terminal ileum is within normal limits. No dilated or inflamed
small bowel loops are identified. Negative stomach and duodenum.

No abdominal free air or free fluid.

Vascular/Lymphatic: Calcified aortic atherosclerosis. Major arterial
structures in the abdomen and pelvis are patent. Portal venous
system is patent.

No lymphadenopathy.

Reproductive: Negative.

Other: No pelvic free fluid.

Musculoskeletal: No acute osseous abnormality identified.
IMPRESSION: 1. Circumferential bladder wall thickening suggesting Acute Cystitis
or UTI. There is questionable urothelial thickening in both distal
ureters, but the upper ureters and both kidneys appear normal. No
urologic calculus identified.
2. No large or small bowel inflammation identified. Normal appendix.
Retained stool throughout the colon.
3. Calcified aortic atherosclerosis appears advanced for age.
Borderline to mild cardiomegaly.

## 2021-02-22 ENCOUNTER — Emergency Department
Admission: EM | Admit: 2021-02-22 | Discharge: 2021-02-22 | Disposition: A | Discharge status: Home / Self Care | Payer: Self-pay | Attending: Emergency Medicine | Admitting: Emergency Medicine

## 2021-02-22 ENCOUNTER — Other Ambulatory Visit: Payer: Self-pay

## 2021-02-22 ENCOUNTER — Encounter: Payer: Self-pay | Admitting: Emergency Medicine

## 2021-02-22 ENCOUNTER — Emergency Department: Payer: Self-pay

## 2021-02-22 DIAGNOSIS — F17200 Nicotine dependence, unspecified, uncomplicated: Secondary | ICD-10-CM | POA: Insufficient documentation

## 2021-02-22 DIAGNOSIS — X58XXXA Exposure to other specified factors, initial encounter: Secondary | ICD-10-CM | POA: Insufficient documentation

## 2021-02-22 DIAGNOSIS — S39012A Strain of muscle, fascia and tendon of lower back, initial encounter: Secondary | ICD-10-CM | POA: Insufficient documentation

## 2021-02-22 MED ORDER — PREDNISONE 20 MG PO TABS: 40.0000 mg | ORAL_TABLET | Freq: Every day | ORAL | 0 refills | Status: AC

## 2021-02-22 MED ORDER — BACLOFEN 10 MG PO TABS: 10.0000 mg | ORAL_TABLET | Freq: Every day | ORAL | 0 refills | Status: AC

## 2021-02-22 MED ORDER — ORPHENADRINE CITRATE 30 MG/ML IJ SOLN
60.0000 mg | INTRAMUSCULAR | Status: AC
  Administered 2021-02-22: 60 mg via INTRAMUSCULAR
  Filled 2021-02-22: qty 2

## 2021-02-22 MED ORDER — LIDOCAINE 5 % EX PTCH
1.0000 | MEDICATED_PATCH | Freq: Once | CUTANEOUS | Status: DC
  Administered 2021-02-22: 1 via TRANSDERMAL
  Filled 2021-02-22: qty 1

## 2021-02-22 MED ORDER — DIPHENHYDRAMINE HCL 25 MG PO CAPS
25.0000 mg | ORAL_CAPSULE | Freq: Once | ORAL | Status: AC
Start: 2021-02-22 — End: 2021-02-22
  Administered 2021-02-22: 25 mg via ORAL
  Filled 2021-02-22: qty 1

## 2021-02-22 MED ORDER — PREDNISONE 20 MG PO TABS
60.0000 mg | ORAL_TABLET | Freq: Once | ORAL | Status: AC
  Administered 2021-02-22: 60 mg via ORAL
  Filled 2021-02-22: qty 3

## 2021-02-22 MED ORDER — OXYCODONE-ACETAMINOPHEN 5-325 MG PO TABS
1.0000 | ORAL_TABLET | Freq: Once | ORAL | Status: AC
Start: 2021-02-22 — End: 2021-02-22
  Administered 2021-02-22: 1 via ORAL
  Filled 2021-02-22: qty 1

## 2021-02-22 MED ORDER — KETOROLAC TROMETHAMINE 30 MG/ML IJ SOLN
30.0000 mg | Freq: Once | INTRAMUSCULAR | Status: AC
  Administered 2021-02-22: 30 mg via INTRAMUSCULAR
  Filled 2021-02-22: qty 1

## 2021-02-22 MED ORDER — LIDOCAINE 5 % EX PTCH: 1.0000 | MEDICATED_PATCH | Freq: Two times a day (BID) | CUTANEOUS | 0 refills | Status: AC | PRN

## 2021-02-22 MED ORDER — OXYCODONE-ACETAMINOPHEN 5-325 MG PO TABS: 1.0000 | ORAL_TABLET | Freq: Four times a day (QID) | ORAL | 0 refills | Status: AC | PRN

## 2021-02-22 NOTE — Discharge Instructions (Signed)
Your exam and XR are consistent with lumbar strain. There is no XR evidence of serious arthritis or nerve injury. Take the meds as prescribed and follow-up [facility] for continued symptom.

## 2021-02-22 NOTE — ED Provider Notes (Signed)
[facility] Emergency Department Provider Note ____________________________________________  Time seen: 1507  I have reviewed the triage vital signs and the nursing notes.  HISTORY  Chief Complaint  Back Pain   HPI William Mcneil is a 50 y.o. male with no significant medical history presents with 2 days  of lower back pain. He denies any preceding injury, trauma, or fall. He is denying urinary symptoms, incontinence, saddle anesthesias, or distal paresthesias.  He does admit to a ride on his [employer], prior to the onset of his back pain.  He has been taking some sporadic doses of Flexeril, and using the e-stim machine he had provided by chiropractor some years prior.  History reviewed. No pertinent past medical history.   There are no problems to display for this patient.   Past Surgical History:  Procedure Laterality Date   HERNIA REPAIR      Prior to Admission medications   Medication Sig Start Date End Date Taking? Authorizing Provider  baclofen (LIORESAL) 10 MG tablet Take 1 tablet (10 mg total) by mouth daily for 10 days. 02/22/21 03/04/21 Yes Jocob Dambach, Charlesetta Ivory, PA-C  lidocaine (LIDODERM) 5 % Place 1 patch onto the skin every 12 (twelve) hours as needed for up to 10 days. Remove & Discard patch after 12 hours of wear each day. 02/22/21 03/04/21 Yes Sharlett Lienemann, Charlesetta Ivory, PA-C  oxyCODONE-acetaminophen (PERCOCET) 5-325 MG tablet Take 1 tablet by mouth every 6 (six) hours as needed for up to 5 days for severe pain. 02/22/21 02/27/21 Yes Johnnetta Holstine, Charlesetta Ivory, PA-C  predniSONE (DELTASONE) 20 MG tablet Take 2 tablets (40 mg total) by mouth daily with breakfast for 5 days. 02/22/21 02/27/21 Yes Bethan Adamek, Charlesetta Ivory, PA-C    Allergies Morphine and related  No family history on file.  Social History Social History   Tobacco Use   Smoking status: Every Day   Smokeless tobacco: Never  Substance Use Topics   Alcohol use: Yes   Drug use: No     Review of Systems  Constitutional: Negative for fever. Eyes: Negative for visual changes. ENT: Negative for sore throat. Cardiovascular: Negative for chest pain. Respiratory: Negative for shortness of breath. Gastrointestinal: Negative for abdominal pain, vomiting and diarrhea. Genitourinary: Negative for dysuria. Musculoskeletal: Positve for back pain. Skin: Negative for rash. Neurological: Negative for headaches, focal weakness or numbness. ____________________________________________  PHYSICAL EXAM:  VITAL SIGNS: ED Triage Vitals  Enc Vitals Group     BP --      Pulse --      Resp --      Temp --      Temp src --      SpO2 --      Weight 02/22/21 1437 245 lb (111.1 kg)     Height 02/22/21 1435 6\' 2"  (1.88 m)     Head Circumference --      Peak Flow --      Pain Score 02/22/21 1435 10     Pain Loc --      Pain Edu? --      Excl. in GC? --     Constitutional: Alert and oriented. Well appearing and in no distress. Head: Normocephalic and atraumatic. Neck: Supple. No thyromegaly. Hematological/Lymphatic/Immunological: No cervical lymphadenopathy. Cardiovascular: Normal rate, regular rhythm. Normal distal pulses. Respiratory: Normal respiratory effort. No wheezes/rales/rhonchi. Gastrointestinal: Soft and nontender. No distention. Musculoskeletal: Normal spinal alignment without midline tenderness, deformity, or step-off.  Patient is having some intermittent muscle spasms  on exam.  Nontender with normal range of motion in all extremities.  Neurologic: Cranial nerves II through XII grossly intact.  Normal LE DTRs bilaterally.  Normal toe dorsiflexion of foot eversion on exam. Normal speech and language. No gross focal neurologic deficits are appreciated. Skin:  Skin is warm, dry and intact. No rash noted. Psychiatric: Mood and affect are normal. Patient exhibits appropriate insight and judgment. ____________________________________________   RADIOLOGY Official  radiology report(s): DG Lumbar Spine Complete  Result Date: 02/22/2021 CLINICAL DATA:  Low back pain EXAM: LUMBAR SPINE - COMPLETE 4+ VIEW COMPARISON:  None. FINDINGS: Degenerative spurring throughout the lumbar spine. Slight disc space narrowing at L1-2 and L2-3. Mild degenerative facet disease in the lower lumbar spine. Normal alignment. No fracture. IMPRESSION: Early degenerative changes.  No acute bony abnormality. Electronically Signed   By: Charlett Nose M.D.   On: 02/22/2021 16:45   ____________________________________________  PROCEDURES  Toradol 30 mg. Norflex 60 mg IM Lidoderm patch 5% topical Prednisone 60 mg p.o.  Procedures ____________________________________________   INITIAL IMPRESSION / ASSESSMENT AND PLAN / ED COURSE  As part of my medical decision making, I reviewed the following data within the electronic MEDICAL RECORD NUMBER Radiograph reviewed NAD and Notes from prior ED visits     DDX: lumbar strain, lumbar radiculopathy, sciatica, DDD   Patient ED evaluation of 2 days of lumbar sacral muscle pain and spasm.  Exam is overall benign reassurance-no red flags on exam.  Patient with out any indication of any acute radiculopathy or cord compression syndrome.  X-ray review is overall reassuring as it shows only mild facet changes and minimal degenerative disc disease.  Patient's clinical picture is likely consistent with muscle spasm and lumbar strain.  He will be discharged with a prescription for prednisone as well as baclofen, Lidoderm patches, and a small course of oxycodone.  He is encouraged to follow-up with his local Ortho provider for ongoing symptom management.  Return precautions have been reviewed.   William Mcneil was evaluated in Emergency Department on 02/22/2021 for the symptoms described in the history of present illness. He was evaluated in the context of the global COVID-19 pandemic, which necessitated consideration that the patient might be at risk for  infection with the SARS-CoV-2 virus that causes COVID-19. Institutional protocols and algorithms that pertain to the evaluation of patients at risk for COVID-19 are in a state of rapid change based on information released by regulatory bodies including the CDC and federal and state organizations. These policies and algorithms were followed during the patient's care in the ED. ____________________________________________  FINAL CLINICAL IMPRESSION(S) / ED DIAGNOSES  Final diagnoses:  Strain of lumbar region, initial encounter      Lissa Hoard, PA-C 02/22/21 2231    Jene Every, MD 02/22/21 2242

## 2021-02-22 NOTE — ED Triage Notes (Signed)
Pt reports back pain to his lower back that started 2 days ago and has progressively gotten worse. Pt denies injuries or fall. Pt denies urinary sx's. Pt reports pain worse with movement

## 2021-07-30 ENCOUNTER — Encounter: Payer: Self-pay | Admitting: *Deleted

## 2021-07-30 ENCOUNTER — Other Ambulatory Visit: Payer: Self-pay

## 2021-07-30 ENCOUNTER — Emergency Department: Payer: Self-pay

## 2021-07-30 DIAGNOSIS — M7121 Synovial cyst of popliteal space [Baker], right knee: Secondary | ICD-10-CM | POA: Insufficient documentation

## 2021-07-30 LAB — BASIC METABOLIC PANEL
Anion gap: 8 (ref 5–15)
BUN: 10 mg/dL (ref 6–20)
CO2: 24 mmol/L (ref 22–32)
Calcium: 9.4 mg/dL (ref 8.9–10.3)
Chloride: 106 mmol/L (ref 98–111)
Creatinine, Ser: 0.98 mg/dL (ref 0.61–1.24)
GFR, Estimated: >60 mL/min (ref >60)
Glucose, Bld: 128 mg/dL — ABNORMAL HIGH (ref 70–99)
Potassium: 4.1 mmol/L (ref 3.5–5.1)
Sodium: 138 mmol/L (ref 135–145)

## 2021-07-30 LAB — CBC
HCT: 47.4 % (ref 39.0–52.0)
Hemoglobin: 15.9 g/dL (ref 13.0–17.0)
MCH: 30.2 pg (ref 26.0–34.0)
MCHC: 33.5 g/dL (ref 30.0–36.0)
MCV: 90.1 fL (ref 80.0–100.0)
Platelets: 296 10*3/uL (ref 150–400)
RBC: 5.26 MIL/uL (ref 4.22–5.81)
RDW: 12.9 % (ref 11.5–15.5)
WBC: 8.3 10*3/uL (ref 4.0–10.5)
nRBC: 0 % (ref 0.0–0.2)

## 2021-07-30 NOTE — ED Triage Notes (Signed)
Pt reports pain in right leg.  No swelling   no known injury.  Pt states pain from knee to groin area.  Pt alert  speech clear.

## 2021-07-30 NOTE — ED Provider Triage Note (Signed)
Emergency Medicine Provider Triage Evaluation Note  William Mcneil, a 51 y.o. male  was evaluated in triage.  Pt complains of right thigh pain.  Reports several days of nontraumatic right pain from the groin to the anterior knee.  Denies any recent injury, trauma, or falls.  Denies any history of DVT, orthopedic problems, or lumbar radiculopathy.  He describes pain as dull and persistent like a toothache.  He denies any bladder or bowel incontinence, foot drop, or saddle anesthesia.  He also denies any lower extremity edema.  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  Review of Systems  Positive: Right thigh pain Negative: Edema, CP, SOB, paresthesias  Physical Exam  BP (!) 159/93 (BP Location: Left Arm)    Pulse 85    Temp 98.6 F (37 C) (Oral)    Resp 18    Ht 6\' 2"  (1.88 m)    Wt 108.9 kg    SpO2 96%    BMI 30.81 kg/m  Gen:   Awake, no distress   Resp:  Normal effort CTA MSK:   Moves extremities without difficulty Antalgic gait Other:  CVS: RRR  Medical Decision Making  Medically screening exam initiated at 7:57 PM.  Appropriate orders placed.  William Mcneil was informed that the remainder of the evaluation will be completed by another provider, this initial triage assessment does not replace that evaluation, and the importance of remaining in the ED until their evaluation is complete.  Patient with ED evaluation of several days of non-traumatic right upper leg pain and disability.   Margo Aye, PA-C 07/30/21 1959

## 2021-07-31 ENCOUNTER — Emergency Department
Admission: EM | Admit: 2021-07-31 | Discharge: 2021-07-31 | Disposition: A | Discharge status: Home / Self Care | Payer: Self-pay | Attending: Emergency Medicine | Admitting: Emergency Medicine

## 2021-07-31 DIAGNOSIS — M79604 Pain in right leg: Secondary | ICD-10-CM

## 2021-07-31 DIAGNOSIS — M7121 Synovial cyst of popliteal space [Baker], right knee: Secondary | ICD-10-CM

## 2021-07-31 MED ORDER — IBUPROFEN 800 MG PO TABS: 800.0000 mg | ORAL_TABLET | Freq: Three times a day (TID) | ORAL | 0 refills | Status: AC | PRN

## 2021-07-31 MED ORDER — OXYCODONE-ACETAMINOPHEN 5-325 MG PO TABS: 1.0000 | ORAL_TABLET | ORAL | 0 refills | Status: AC | PRN

## 2021-07-31 MED ORDER — IBUPROFEN 800 MG PO TABS
800.0000 mg | ORAL_TABLET | Freq: Once | ORAL | Status: AC
  Administered 2021-07-31: 800 mg via ORAL
  Filled 2021-07-31: qty 1

## 2021-07-31 MED ORDER — OXYCODONE-ACETAMINOPHEN 5-325 MG PO TABS
1.0000 | ORAL_TABLET | Freq: Once | ORAL | Status: AC
  Administered 2021-07-31: 1 via ORAL
  Filled 2021-07-31: qty 1

## 2021-07-31 NOTE — ED Notes (Signed)
Pt evaluated by d/c by provider in triage

## 2021-07-31 NOTE — ED Provider Notes (Signed)
[facility] Provider Note    Event Date/Time   First MD Initiated Contact with Patient 07/31/21 0247     (approximate)   History   Leg Pain   HPI  William Mcneil is a 51 y.o. male who presents to the ED from home with a chief complaint of nontraumatic right leg pain.  Patient reports pain from his posterior knee to groin area for the past couple of days.  Denies fall/injury/trauma.  Does state he is on his feet a lot during the day.  Denies fever, cough, chest pain, shortness of breath, abdominal pain, nausea or vomiting.     Past Medical History  No past medical history on file.   Active Problem List  There are no problems to display for this patient.    Past Surgical History   Past Surgical History:  Procedure Laterality Date   HERNIA REPAIR       Home Medications   Prior to Admission medications   Medication Sig Start Date End Date Taking? Authorizing Provider  ibuprofen (ADVIL) 800 MG tablet Take 1 tablet (800 mg total) by mouth every 8 (eight) hours as needed for moderate pain. 07/31/21  Yes [name], MD  oxyCODONE-acetaminophen (PERCOCET/ROXICET) 5-325 MG tablet Take 1 tablet by mouth every 4 (four) hours as needed for severe pain. 07/31/21  Yes [name], MD     Allergies  Morphine and related   Family History  No family history on file.   Physical Exam  Triage Vital Signs: ED Triage Vitals  Enc Vitals Group     BP 07/30/21 1942 (!) 159/93     Pulse Rate 07/30/21 1942 85     Resp 07/30/21 1942 18     Temp 07/30/21 1942 98.6 F (37 C)     Temp Source 07/30/21 1942 Oral     SpO2 07/30/21 1942 96 %     Weight 07/30/21 1943 240 lb (108.9 kg)     Height 07/30/21 1943 6\' 2"  (1.88 m)     Head Circumference --      Peak Flow --      Pain Score 07/30/21 1943 10     Pain Loc --      Pain Edu? --      Excl. in GC? --     Updated Vital Signs: BP (!) 142/83 (BP Location: Left Arm)    Pulse 74    Temp 98.6 F (37 C)  (Oral)    Resp 16    Ht 6\' 2"  (1.88 m)    Wt 108.9 kg    SpO2 97%    BMI 30.81 kg/m    General: Awake, mild distress.  CV:  Good peripheral perfusion.  Resp:  Normal effort.  Abd:  No distention.  RLE:  Pants taken off to fully examine lower extremities.  Tender to palpation with mild swelling popliteal fossa.  No calf swelling or Homans' sign.  Palpable femoral and distal pulses.  Symmetrically warm limbs without evidence for ischemia.  Brisk, less than 5-second capillary refill.   ED Results / Procedures / Treatments  Labs (all labs ordered are listed, but only abnormal results are displayed) Labs Reviewed  BASIC METABOLIC PANEL - Abnormal; Notable for the following components:      Result Value   Glucose, Bld 128 (*)    All other components within normal limits  CBC     EKG  None   RADIOLOGY I have personally  reviewed patient's ultrasound as well as the radiology interpretation:  Baker's cyst, no DVT   Official radiology report(s): US Venous Img Lower Unilateral Right  Result Date: 07/30/2021 CLINICAL DATA:  Right lower extremity pain and disability from site in the. EXAM: RIGHT LOWER EXTREMITY VENOUS DOPPLER ULTRASOUND TECHNIQUE: Gray-scale sonography with compression, as well as color and duplex ultrasound, were performed to evaluate the deep venous system(s) from the level of the common femoral vein through the popliteal and proximal calf veins. COMPARISON:  None. FINDINGS: VENOUS Normal compressibility of the common femoral, superficial femoral, and popliteal veins, as well as the visualized calf veins. Visualized portions of profunda femoral vein and great saphenous vein unremarkable. No filling defects to suggest DVT on grayscale or color Doppler imaging. Doppler waveforms show normal direction of venous flow, normal respiratory plasticity and response to augmentation. Limited views of the contralateral common femoral vein are unremarkable. OTHER Avascular fluid  collection in the popliteal fossa measures 3.3 x 1.2 x 2.9 cm. Limitations: none IMPRESSION: 1. No evidence of right lower extremity DVT. 2. Probable Baker cyst in the popliteal fossa. Electronically Signed   By: Narda Rutherford M.D.   On: 07/30/2021 21:01     PROCEDURES:  Critical Care performed: No  Procedures   MEDICATIONS ORDERED IN ED: Medications  oxyCODONE-acetaminophen (PERCOCET/ROXICET) 5-325 MG per tablet 1 tablet (1 tablet Oral Given 07/31/21 0308)  ibuprofen (ADVIL) tablet 800 mg (800 mg Oral Given 07/31/21 0308)     IMPRESSION / MDM / ASSESSMENT AND PLAN / ED COURSE  I reviewed the triage vital signs and the nursing notes.                              51 year old male presenting with RLE nontraumatic pain.  Differential diagnosis includes but is not limited to musculoskeletal strain, DVT, Baker's cyst, etc.  Laboratory results unremarkable.  Ultrasound demonstrates right Baker's cyst.  Will place an Ace wrap, provide crutches, start NSAIDs and analgesia.  Patient will follow up with orthopedics as needed.  Strict return precautions given.  Patient verbalizes understanding and agrees with plan of care.         FINAL CLINICAL IMPRESSION(S) / ED DIAGNOSES   Final diagnoses:  Synovial cyst of right popliteal space  Right leg pain     Rx / DC Orders   ED Discharge Orders          Ordered    ibuprofen (ADVIL) 800 MG tablet  Every 8 hours PRN        07/31/21 0257    oxyCODONE-acetaminophen (PERCOCET/ROXICET) 5-325 MG tablet  Every 4 hours PRN        07/31/21 0257             Note:  This document was prepared using Dragon voice recognition software and may include unintentional dictation errors.   [name], MD 07/31/21 [phone]

## 2021-07-31 NOTE — Discharge Instructions (Addendum)
1.  You may take pain medicines as needed (Motrin/Percocet #15). 2.  You may remove Ace wrap to bathe and sleep. 3.  Use crutches as needed to help you balance as you walk. 4.  Return to the ER for worsening symptoms, persistent vomiting, difficulty breathing or other concerns.

## 2023-07-28 ENCOUNTER — Other Ambulatory Visit: Payer: Self-pay

## 2023-07-28 ENCOUNTER — Emergency Department (HOSPITAL_COMMUNITY)
Admission: EM | Admit: 2023-07-28 | Discharge: 2023-07-28 | Discharge status: Against Medical Advice | Payer: 59 | Attending: Emergency Medicine | Admitting: Emergency Medicine

## 2023-07-28 ENCOUNTER — Encounter (HOSPITAL_COMMUNITY): Payer: Self-pay

## 2023-07-28 DIAGNOSIS — R509 Fever, unspecified: Secondary | ICD-10-CM | POA: Insufficient documentation

## 2023-07-28 DIAGNOSIS — Z5321 Procedure and treatment not carried out due to patient leaving prior to being seen by health care provider: Secondary | ICD-10-CM | POA: Diagnosis not present

## 2023-07-28 DIAGNOSIS — R197 Diarrhea, unspecified: Secondary | ICD-10-CM | POA: Diagnosis not present

## 2023-07-28 DIAGNOSIS — Z20822 Contact with and (suspected) exposure to covid-19: Secondary | ICD-10-CM | POA: Insufficient documentation

## 2023-07-28 DIAGNOSIS — R112 Nausea with vomiting, unspecified: Secondary | ICD-10-CM | POA: Diagnosis present

## 2023-07-28 DIAGNOSIS — R059 Cough, unspecified: Secondary | ICD-10-CM | POA: Insufficient documentation

## 2023-07-28 LAB — CBC WITH DIFFERENTIAL/PLATELET
Abs Immature Granulocytes: 0.01 10*3/uL (ref 0.00–0.07)
Basophils Absolute: 0.1 10*3/uL (ref 0.0–0.1)
Basophils Relative: 1 %
Eosinophils Absolute: 0.1 10*3/uL (ref 0.0–0.5)
Eosinophils Relative: 2 %
HCT: 43.5 % (ref 39.0–52.0)
Hemoglobin: 14.6 g/dL (ref 13.0–17.0)
Immature Granulocytes: 0 %
Lymphocytes Relative: 34 %
Lymphs Abs: 1.8 10*3/uL (ref 0.7–4.0)
MCH: 31.1 pg (ref 26.0–34.0)
MCHC: 33.6 g/dL (ref 30.0–36.0)
MCV: 92.8 fL (ref 80.0–100.0)
Monocytes Absolute: 0.3 10*3/uL (ref 0.1–1.0)
Monocytes Relative: 5 %
Neutro Abs: 3.1 10*3/uL (ref 1.7–7.7)
Neutrophils Relative %: 58 %
Platelets: 244 10*3/uL (ref 150–400)
RBC: 4.69 MIL/uL (ref 4.22–5.81)
RDW: 13.2 % (ref 11.5–15.5)
WBC: 5.4 10*3/uL (ref 4.0–10.5)
nRBC: 0 % (ref 0.0–0.2)

## 2023-07-28 LAB — COMPREHENSIVE METABOLIC PANEL
ALT: 17 U/L (ref 0–44)
AST: 17 U/L (ref 15–41)
Albumin: 3.9 g/dL (ref 3.5–5.0)
Alkaline Phosphatase: 68 U/L (ref 38–126)
Anion gap: 6 (ref 5–15)
BUN: 8 mg/dL (ref 6–20)
CO2: 23 mmol/L (ref 22–32)
Calcium: 8.8 mg/dL — ABNORMAL LOW (ref 8.9–10.3)
Chloride: 109 mmol/L (ref 98–111)
Creatinine, Ser: 0.81 mg/dL (ref 0.61–1.24)
GFR, Estimated: >60 mL/min (ref >60)
Glucose, Bld: 79 mg/dL (ref 70–99)
Potassium: 4 mmol/L (ref 3.5–5.1)
Sodium: 138 mmol/L (ref 135–145)
Total Bilirubin: 0.7 mg/dL (ref 0.0–1.2)
Total Protein: 7 g/dL (ref 6.5–8.1)

## 2023-07-28 LAB — RESP PANEL BY RT-PCR (RSV, FLU A&B, COVID)  RVPGX2
Influenza A by PCR: NEGATIVE
Influenza B by PCR: NEGATIVE
Resp Syncytial Virus by PCR: NEGATIVE
SARS Coronavirus 2 by RT PCR: NEGATIVE

## 2023-07-28 LAB — LIPASE, BLOOD: Lipase: 23 U/L (ref 11–51)

## 2023-07-28 NOTE — ED Triage Notes (Signed)
 Pt arrives via POV. Reports he believes he as the flu. Pt reports nausea, vomiting, diarrhea, cough, and fever for the past couple of days. Pt AxOx4.

## 2023-07-28 NOTE — ED Notes (Signed)
 Pt left AMA

## 2023-07-28 NOTE — ED Provider Triage Note (Signed)
 Emergency Medicine Provider Triage Evaluation Note  William Mcneil , a 53 y.o. male  was evaluated in triage.  Pt complains of nausea, vomiting, and diarrhea.  Patient states he has been sick for the last 2 weeks.  He has had some subjective fever but that is since resolved.  He reports associated productive cough.  Denies any chest pain or shortness of breath.  Review of Systems  Positive:  Negative: See above   Physical Exam  BP (!) 150/88 (BP Location: Left Arm)   Pulse 78   Temp 98.2 F (36.8 C) (Oral)   Resp 18   Ht 6' 2 (1.88 m)   Wt 99.8 kg   SpO2 98%   BMI 28.25 kg/m  Gen:   Awake, no distress   Resp:  Normal effort  MSK:   Moves extremities without difficulty  Other:  No abdominal tenderness.  Medical Decision Making  Medically screening exam initiated at 5:06 PM.  Appropriate orders placed.  William Mcneil was informed that the remainder of the evaluation will be completed by another provider, this initial triage assessment does not replace that evaluation, and the importance of remaining in the ED until their evaluation is complete.     William Mcneil, [place] 07/28/23 [phone]

## 2023-07-29 ENCOUNTER — Emergency Department
Admission: EM | Admit: 2023-07-29 | Discharge: 2023-07-29 | Disposition: A | Discharge status: Home / Self Care | Payer: 59 | Attending: Emergency Medicine | Admitting: Emergency Medicine

## 2023-07-29 ENCOUNTER — Other Ambulatory Visit: Payer: Self-pay

## 2023-07-29 DIAGNOSIS — Z5321 Procedure and treatment not carried out due to patient leaving prior to being seen by health care provider: Secondary | ICD-10-CM | POA: Insufficient documentation

## 2023-07-29 DIAGNOSIS — R197 Diarrhea, unspecified: Secondary | ICD-10-CM | POA: Insufficient documentation

## 2023-07-29 DIAGNOSIS — R112 Nausea with vomiting, unspecified: Secondary | ICD-10-CM | POA: Insufficient documentation

## 2023-07-29 DIAGNOSIS — Z20822 Contact with and (suspected) exposure to covid-19: Secondary | ICD-10-CM | POA: Diagnosis not present

## 2023-07-29 DIAGNOSIS — R109 Unspecified abdominal pain: Secondary | ICD-10-CM | POA: Insufficient documentation

## 2023-07-29 LAB — CBC
HCT: 42.2 % (ref 39.0–52.0)
Hemoglobin: 14 g/dL (ref 13.0–17.0)
MCH: 30.2 pg (ref 26.0–34.0)
MCHC: 33.2 g/dL (ref 30.0–36.0)
MCV: 91.1 fL (ref 80.0–100.0)
Platelets: 239 10*3/uL (ref 150–400)
RBC: 4.63 MIL/uL (ref 4.22–5.81)
RDW: 13.2 % (ref 11.5–15.5)
WBC: 5.2 10*3/uL (ref 4.0–10.5)
nRBC: 0 % (ref 0.0–0.2)

## 2023-07-29 LAB — RESP PANEL BY RT-PCR (RSV, FLU A&B, COVID)  RVPGX2
Influenza A by PCR: NEGATIVE
Influenza B by PCR: NEGATIVE
Resp Syncytial Virus by PCR: NEGATIVE
SARS Coronavirus 2 by RT PCR: NEGATIVE

## 2023-07-29 LAB — BASIC METABOLIC PANEL
Anion gap: 9 (ref 5–15)
BUN: 9 mg/dL (ref 6–20)
CO2: 26 mmol/L (ref 22–32)
Calcium: 8.9 mg/dL (ref 8.9–10.3)
Chloride: 104 mmol/L (ref 98–111)
Creatinine, Ser: 0.96 mg/dL (ref 0.61–1.24)
GFR, Estimated: >60 mL/min (ref >60)
Glucose, Bld: 105 mg/dL — ABNORMAL HIGH (ref 70–99)
Potassium: 4.2 mmol/L (ref 3.5–5.1)
Sodium: 139 mmol/L (ref 135–145)

## 2023-07-29 NOTE — ED Triage Notes (Addendum)
 Pt presents to ED with c/o of ABD pain, N/V/D.  Pt states possible fever. Pt states ongoing for 1 week. NAD noted. Pt states seen at [facility] yesterday but left due to wait.
